# Patient Record
Sex: Female | Born: 2000 | Race: White | Hispanic: No | Marital: Single
Health system: Southern US, Community
[De-identification: ages and names within clinical notes are randomized; demographics above are authoritative.]

## PROBLEM LIST (undated history)

## (undated) DIAGNOSIS — F32A Depression, unspecified: Secondary | ICD-10-CM

## (undated) DIAGNOSIS — F419 Anxiety disorder, unspecified: Secondary | ICD-10-CM

## (undated) DIAGNOSIS — F909 Attention-deficit hyperactivity disorder, unspecified type: Secondary | ICD-10-CM

## (undated) HISTORY — DX: Anxiety disorder, unspecified: F41.9

## (undated) HISTORY — DX: Depression, unspecified: F32.A

## (undated) HISTORY — DX: Attention-deficit hyperactivity disorder, unspecified type: F90.9

---
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
## 2023-08-03 DIAGNOSIS — F4310 Post-traumatic stress disorder, unspecified: Secondary | ICD-10-CM | POA: Diagnosis not present

## 2023-08-03 DIAGNOSIS — F902 Attention-deficit hyperactivity disorder, combined type: Secondary | ICD-10-CM | POA: Diagnosis not present

## 2023-08-03 DIAGNOSIS — F331 Major depressive disorder, recurrent, moderate: Secondary | ICD-10-CM | POA: Diagnosis not present

## 2023-09-22 DIAGNOSIS — F4311 Post-traumatic stress disorder, acute: Secondary | ICD-10-CM | POA: Diagnosis not present

## 2023-09-27 DIAGNOSIS — F4311 Post-traumatic stress disorder, acute: Secondary | ICD-10-CM | POA: Diagnosis not present

## 2023-10-03 DIAGNOSIS — F4311 Post-traumatic stress disorder, acute: Secondary | ICD-10-CM | POA: Diagnosis not present

## 2024-03-04 DIAGNOSIS — R519 Headache, unspecified: Secondary | ICD-10-CM | POA: Diagnosis not present

## 2024-03-04 DIAGNOSIS — J019 Acute sinusitis, unspecified: Secondary | ICD-10-CM | POA: Diagnosis not present

## 2024-04-04 ENCOUNTER — Ambulatory Visit: Admitting: Internal Medicine

## 2024-04-04 ENCOUNTER — Encounter: Payer: Self-pay | Admitting: Internal Medicine

## 2024-04-04 VITALS — BP 120/80 | HR 84 | Temp 97.6°F | Ht 67.0 in | Wt 169.4 lb

## 2024-04-04 DIAGNOSIS — F419 Anxiety disorder, unspecified: Secondary | ICD-10-CM

## 2024-04-04 DIAGNOSIS — F909 Attention-deficit hyperactivity disorder, unspecified type: Secondary | ICD-10-CM

## 2024-04-04 DIAGNOSIS — F32A Depression, unspecified: Secondary | ICD-10-CM | POA: Diagnosis not present

## 2024-04-04 MED ORDER — AMPHETAMINE-DEXTROAMPHETAMINE 20 MG PO TABS: 20.0000 mg | ORAL_TABLET | Freq: Two times a day (BID) | ORAL | 0 refills | Status: DC

## 2024-04-04 MED ORDER — FLUOXETINE HCL 20 MG PO CAPS: 20.0000 mg | ORAL_CAPSULE | Freq: Every day | ORAL | 1 refills | Status: DC

## 2024-04-04 NOTE — Progress Notes (Signed)
 [facility] [address] [place] [address] Dept: [phone] Dept Fax: [phone]  New Patient Office Visit  Subjective:   Courtney Henry [birth year] 04/04/2024  Chief Complaint  Patient presents with   Establish Care   Medication Refill    HPI: LESSA HUGE presents today to establish care at [facility]. Introduced to Publishing rights manager role and practice setting.  All questions answered.  Concerns: See below   Discussed the use of AI scribe software for clinical note transcription with the patient, who gave verbal consent to proceed.  History of Present Illness   Courtney Henry is a 23 year old female with ADHD, anxiety, and depression who presents to establish care and resume medication management.  She has not been on her medications for about a year due to an insurance change and re-establishing care. Previously, she was taking Adderall 20 mg twice a day for ADHD, which she found effective for her attention issues, although she did not always take the second dose. For anxiety and depression, she was on Prozac, starting at 20 mg and increasing to 40 mg, which she felt helped manage her symptoms. She is unsure if she should restart at 20 mg or a different dose.  Since discontinuing her medications, her anxiety and depression are still present but not as severe as before. No SI/HI. She has not engaged in counseling for about a year, although she has seen several counselors over the past few years. She plans to follow up with a counseling service she found in [place].        04/04/2024    2:16 PM  Depression screen PHQ 2/9  Decreased Interest 1  Down, Depressed, Hopeless 1  PHQ - 2 Score 2  Altered sleeping 0  Tired, decreased energy 3  Change in appetite 0  Feeling bad or failure about yourself  2  Trouble concentrating 3  Moving slowly or fidgety/restless 0  Suicidal  thoughts 0  PHQ-9 Score 10  Difficult doing work/chores Somewhat difficult      04/04/2024    2:16 PM  GAD 7 : Generalized Anxiety Score  Nervous, Anxious, on Edge 2  Control/stop worrying 2  Worry too much - different things 2  Trouble relaxing 2  Restless 1  Easily annoyed or irritable 3  Afraid - awful might happen 1  Total GAD 7 Score 13  Anxiety Difficulty Somewhat difficult      The following portions of the patient's history were reviewed and updated as appropriate: past medical history, past surgical history, family history, social history, allergies, medications, and problem list.   Patient Active Problem List   Diagnosis Date Noted   Left ankle injury, subsequent encounter 05/24/2017   Past Medical History:  Diagnosis Date   ADHD    Anxiety    Depression    History reviewed. No pertinent surgical history. Family History  Problem Relation Age of Onset   Diabetes Maternal Grandfather    Hypertension Maternal Grandfather    Diabetes Maternal Grandmother    Hypertension Maternal Grandmother    Alcohol abuse Paternal Grandfather    Heart disease Paternal Grandmother    Hypertension Paternal Grandmother     Current Outpatient Medications:    amphetamine-dextroamphetamine (ADDERALL) 20 MG tablet, Take 1 tablet (20 mg total) by mouth 2 (two) times daily., Disp: 60 tablet, Rfl: 0   FLUoxetine (PROZAC) 20 MG capsule, Take 1 capsule (20 mg total) by  mouth daily., Disp: 90 capsule, Rfl: 1   ibuprofen  (ADVIL ,MOTRIN ) 400 MG tablet, Take 400 mg by mouth every 6 (six) hours as needed., Disp: , Rfl:  No Known Allergies  ROS: A complete ROS was performed with pertinent positives/negatives noted in the HPI. The remainder of the ROS are negative.   Objective:   Today's Vitals   04/04/24 1354  BP: 120/80  Pulse: 84  Temp: 97.6 F (36.4 C)  TempSrc: Temporal  SpO2: 99%  Weight: 169 lb 6.4 oz (76.8 kg)  Height: 5\' 7"  (1.702 m)    GENERAL: Well-appearing, in NAD.  Well nourished.  SKIN: Pink, warm and dry. No rash, lesion, ulceration, or ecchymoses.  NECK: Trachea midline. Full ROM w/o pain or tenderness. No lymphadenopathy.  RESPIRATORY: Chest wall symmetrical. Respirations even and non-labored. Breath sounds clear to auscultation bilaterally.  CARDIAC: S1, S2 present, regular rate and rhythm. Peripheral pulses 2+ bilaterally.  EXTREMITIES: Without clubbing, cyanosis, or edema.  NEUROLOGIC:  Steady, even gait.  PSYCH/MENTAL STATUS: Alert, oriented x 3. Cooperative, appropriate mood and affect.   Health Maintenance Due  Topic Date Due   CHLAMYDIA SCREENING  Never done   HIV Screening  Never done   Meningococcal B Vaccine (1 of 2 - Standard) Never done   Hepatitis C Screening  Never done   Cervical Cancer Screening (Pap smear)  Never done   DTaP/Tdap/Td (7 - Td or Tdap) 07/04/2021   COVID-19 Vaccine (3 - 2024-25 season) 07/17/2023    No results found for any visits on 04/04/24.  Assessment & Plan:   Assessment and Plan    Attention-deficit hyperactivity disorder ADHD symptoms well-controlled with Adderall previously. Off medication for one year, desires to restart. - Restart Adderall 20 mg twice daily. - Perform urine drug screen. - Sign controlled substance agreement.  Anxiety disorder Anxiety symptoms less severe off Prozac for one year. Open to restarting medication and counseling. - Restart Prozac 20 mg daily. Can increase to 40mg  if needed in future - Encourage resumption of counseling services.  Depression Depression symptoms less severe off Prozac for one year. Open to restarting medication and counseling. - Restart Prozac 20 mg daily. Can increase to 40mg  if needed in future - Encourage resumption of counseling services.        Orders Placed This Encounter  Procedures   ToxASSURE Select 13 (MW), Urine     Current Outpatient Medications:    amphetamine-dextroamphetamine (ADDERALL) 20 MG tablet, Take 1 tablet (20 mg  total) by mouth 2 (two) times daily., Disp: 60 tablet, Rfl: 0   FLUoxetine (PROZAC) 20 MG capsule, Take 1 capsule (20 mg total) by mouth daily., Disp: 90 capsule, Rfl: 1   ibuprofen  (ADVIL ,MOTRIN ) 400 MG tablet, Take 400 mg by mouth every 6 (six) hours as needed., Disp: , Rfl:    Meds ordered this encounter  Medications   FLUoxetine (PROZAC) 20 MG capsule    Sig: Take 1 capsule (20 mg total) by mouth daily.    Dispense:  90 capsule    Refill:  1    Supervising Provider:   THOMPSON, AARON B [[id number]]   amphetamine-dextroamphetamine (ADDERALL) 20 MG tablet    Sig: Take 1 tablet (20 mg total) by mouth 2 (two) times daily.    Dispense:  60 tablet    Refill:  0    Supervising Provider:   THOMPSON, AARON B [[id number]]    Return in about 3 months (around 07/05/2024) for Annual Physical Exam with fasting lab work.  Gavin Kast, FNP

## 2024-04-05 DIAGNOSIS — F32A Depression, unspecified: Secondary | ICD-10-CM | POA: Insufficient documentation

## 2024-04-05 DIAGNOSIS — F909 Attention-deficit hyperactivity disorder, unspecified type: Secondary | ICD-10-CM | POA: Insufficient documentation

## 2024-04-07 LAB — TOXASSURE SELECT 13 (MW), URINE

## 2024-05-15 ENCOUNTER — Encounter: Payer: Self-pay | Admitting: Internal Medicine

## 2024-05-15 DIAGNOSIS — F909 Attention-deficit hyperactivity disorder, unspecified type: Secondary | ICD-10-CM

## 2024-05-17 MED ORDER — AMPHETAMINE-DEXTROAMPHETAMINE 20 MG PO TABS: 20.0000 mg | ORAL_TABLET | Freq: Two times a day (BID) | ORAL | 0 refills | Status: DC

## 2024-07-05 ENCOUNTER — Encounter: Admitting: Internal Medicine

## 2024-07-11 ENCOUNTER — Encounter: Payer: Self-pay | Admitting: Internal Medicine

## 2024-07-11 ENCOUNTER — Ambulatory Visit (INDEPENDENT_AMBULATORY_CARE_PROVIDER_SITE_OTHER): Admitting: Internal Medicine

## 2024-07-11 VITALS — BP 104/64 | HR 98 | Temp 98.7°F | Ht 67.0 in | Wt 158.6 lb

## 2024-07-11 DIAGNOSIS — Z114 Encounter for screening for human immunodeficiency virus [HIV]: Secondary | ICD-10-CM

## 2024-07-11 DIAGNOSIS — Z0000 Encounter for general adult medical examination without abnormal findings: Secondary | ICD-10-CM | POA: Diagnosis not present

## 2024-07-11 DIAGNOSIS — Z23 Encounter for immunization: Secondary | ICD-10-CM | POA: Diagnosis not present

## 2024-07-11 DIAGNOSIS — Z1159 Encounter for screening for other viral diseases: Secondary | ICD-10-CM | POA: Diagnosis not present

## 2024-07-11 LAB — CBC WITH DIFFERENTIAL/PLATELET
Basophils Absolute: 0.1 K/uL (ref 0.0–0.1)
Basophils Relative: 1.2 % (ref 0.0–3.0)
Eosinophils Absolute: 0.1 K/uL (ref 0.0–0.7)
Eosinophils Relative: 1.1 % (ref 0.0–5.0)
HCT: 37.7 % (ref 36.0–46.0)
Hemoglobin: 12.3 g/dL (ref 12.0–15.0)
Lymphocytes Relative: 27.6 % (ref 12.0–46.0)
Lymphs Abs: 1.5 K/uL (ref 0.7–4.0)
MCHC: 32.5 g/dL (ref 30.0–36.0)
MCV: 83.9 fl (ref 78.0–100.0)
Monocytes Absolute: 0.5 K/uL (ref 0.1–1.0)
Monocytes Relative: 9.1 % (ref 3.0–12.0)
Neutro Abs: 3.3 K/uL (ref 1.4–7.7)
Neutrophils Relative %: 61 % (ref 43.0–77.0)
Platelets: 230 K/uL (ref 150.0–400.0)
RBC: 4.5 Mil/uL (ref 3.87–5.11)
RDW: 14.5 % (ref 11.5–15.5)
WBC: 5.4 K/uL (ref 4.0–10.5)

## 2024-07-11 LAB — LIPID PANEL
Cholesterol: 180 mg/dL (ref 0–200)
HDL: 72.1 mg/dL (ref >39.00)
LDL Cholesterol: 100 mg/dL — ABNORMAL HIGH (ref 0–99)
NonHDL: 108.15
Total CHOL/HDL Ratio: 3
Triglycerides: 39 mg/dL (ref 0.0–149.0)
VLDL: 7.8 mg/dL (ref 0.0–40.0)

## 2024-07-11 LAB — COMPREHENSIVE METABOLIC PANEL WITH GFR
ALT: 21 U/L (ref 0–35)
AST: 24 U/L (ref 0–37)
Albumin: 4.5 g/dL (ref 3.5–5.2)
Alkaline Phosphatase: 81 U/L (ref 39–117)
BUN: 14 mg/dL (ref 6–23)
CO2: 25 meq/L (ref 19–32)
Calcium: 9.3 mg/dL (ref 8.4–10.5)
Chloride: 104 meq/L (ref 96–112)
Creatinine, Ser: 0.86 mg/dL (ref 0.40–1.20)
GFR: 94.68 mL/min (ref >60.00)
Glucose, Bld: 89 mg/dL (ref 70–99)
Potassium: 3.7 meq/L (ref 3.5–5.1)
Sodium: 138 meq/L (ref 135–145)
Total Bilirubin: 0.6 mg/dL (ref 0.2–1.2)
Total Protein: 7.3 g/dL (ref 6.0–8.3)

## 2024-07-11 LAB — TSH: TSH: 1.16 u[IU]/mL (ref 0.35–5.50)

## 2024-07-11 NOTE — Progress Notes (Signed)
 Subjective:   Courtney Henry 08/05/2001  07/11/2024   CC: Chief Complaint  Patient presents with   Annual Exam    Fasting     HPI: Courtney Henry is a 23 y.o. female who presents for a routine health maintenance exam. Patient is doing well, no concerns. 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  Labs  collected at time of visit.    HEALTH SCREENINGS: - Pap smear: pt verbalized she will do with OBGYN  - Mammogram (40+): Not applicable  - Colonoscopy (45+): Not applicable  - Bone Density (65+): Not applicable  - Lung CA screening with low-dose CT:  Not applicable Adults age 52-80 who are current cigarette smokers or quit within the last 15 years. Must have 20 pack year history.   IMMUNIZATIONS: - Tdap: Tetanus vaccination status reviewed: Tdap vaccination indicated and given today. - HPV: Up to date - Influenza: Postponed to flu season - Prevnar 20: Not applicable - Zostavax (50+): Not applicable   Past medical history, surgical history, medications, allergies, family history and social history reviewed with patient today and changes made to appropriate areas of the chart.   Social History   Socioeconomic History   Marital status: Single    Spouse name: Not on file   Number of children: Not on file   Years of education: Not on file   Highest education level: Associate degree: academic program  Occupational History   Not on file  Tobacco Use   Smoking status: Passive Smoke Exposure - Never Smoker    Passive exposure: Yes   Smokeless tobacco: Never  Vaping Use   Vaping status: Never Used  Substance and Sexual Activity   Alcohol use: Yes    Comment: 5 drinks wine/beer every other weekend   Drug use: No   Sexual activity: Yes    Birth control/protection: Condom  Other Topics Concern   Not on file  Social History Narrative   Not on file   Social Drivers of Health   Financial Resource Strain: Low Risk  (04/03/2024)   Overall Financial Resource Strain  (CARDIA)    Difficulty of Paying Living Expenses: Not hard at all  Food Insecurity: No Food Insecurity (04/03/2024)   Hunger Vital Sign    Worried About Running Out of Food in the Last Year: Never true    Ran Out of Food in the Last Year: Never true  Transportation Needs: No Transportation Needs (04/03/2024)   PRAPARE - Administrator, Civil Service (Medical): No    Lack of Transportation (Non-Medical): No  Physical Activity: Sufficiently Active (04/03/2024)   Exercise Vital Sign    Days of Exercise per Week: 3 days    Minutes of Exercise per Session: 60 min  Stress: Stress Concern Present (04/03/2024)   [employer] of Occupational Health - Occupational Stress Questionnaire    Feeling of Stress : To some extent  Social Connections: Unknown (04/03/2024)   Social Connection and Isolation Panel    Frequency of Communication with Friends and Family: Twice a week    Frequency of Social Gatherings with Friends and Family: Once a week    Attends Religious Services: 1 to 4 times per year    Active Member of [employer] or Organizations: No    Attends Banker Meetings: Not on file    Marital Status: Not on file  Intimate Partner Violence: Unknown (02/17/2022)   Received from [facility]   HITS    Physically  Hurt: Not on file    Insult or Talk Down To: Not on file    Threaten Physical Harm: Not on file    Scream or Curse: Not on file     Past Medical History:  Diagnosis Date   ADHD    Anxiety    Depression     History reviewed. No pertinent surgical history.  Current Outpatient Medications on File Prior to Visit  Medication Sig   amphetamine -dextroamphetamine  (ADDERALL) 20 MG tablet Take 1 tablet (20 mg total) by mouth 2 (two) times daily.   amphetamine -dextroamphetamine  (ADDERALL) 20 MG tablet Take 1 tablet (20 mg total) by mouth 2 (two) times daily.   [START ON 07/18/2024] amphetamine -dextroamphetamine  (ADDERALL) 20 MG tablet Take 1 tablet (20 mg total) by  mouth 2 (two) times daily.   FLUoxetine  (PROZAC ) 20 MG capsule Take 1 capsule (20 mg total) by mouth daily.   ibuprofen  (ADVIL ,MOTRIN ) 400 MG tablet Take 400 mg by mouth every 6 (six) hours as needed.   No current facility-administered medications on file prior to visit.    No Known Allergies  Family History  Problem Relation Age of Onset   Diabetes Maternal Grandfather    Hypertension Maternal Grandfather    Diabetes Maternal Grandmother    Hypertension Maternal Grandmother    Alcohol abuse Paternal Grandfather    Heart disease Paternal Grandmother    Hypertension Paternal Grandmother      ROS: Denies fever, fatigue, unexplained weight loss/gain, hearing or vision changes, cardiac or respiratory complaints. Denies neurological deficits, musculoskeletal complaints, gastrointestinal or genitourinary complaints, mental health complaints, and skin changes.   Objective:   Today's Vitals   07/11/24 0936  BP: 104/64  Pulse: 98  Temp: 98.7 F (37.1 C)  TempSrc: Temporal  SpO2: 98%  Weight: 158 lb 9.6 oz (71.9 kg)  Height: 5' 7 (1.702 m)    GENERAL APPEARANCE: Well-appearing, in NAD. Well nourished.  SKIN: Pink, warm and dry. Turgor normal. No rash, lesion, ulceration, or ecchymoses. Hair evenly distributed.  HEENT: HEAD: Normocephalic.  EYES: PERRLA. EOMI. Lids intact w/o defect. Sclera white, Conjunctiva pink w/o exudate.  EARS: External ear w/o redness, swelling, masses or lesions. EAC clear. TM's intact, translucent w/o bulging, appropriate landmarks visualized. Appropriate acuity to conversational tones.  NOSE: Septum midline w/o deformity. Nares patent, mucosa pink and non-inflamed w/o drainage.  THROAT: Uvula midline. Oropharynx clear. Tonsils non-inflamed w/o exudate . Oral mucosa pink and moist.  NECK: Supple, Trachea midline. Full ROM w/o pain or tenderness. No lymphadenopathy. Thyroid non-tender w/o enlargement or palpable masses.  BREASTS: Breasts pendulous,  symmetrical, and w/o palpable masses. Nipples everted and w/o discharge. No rash or skin retraction. No axillary or supraclavicular lymphadenopathy.  RESPIRATORY: Chest wall symmetrical w/o masses. Respirations even and non-labored. Breath sounds clear to auscultation bilaterally. No wheezes, rales, rhonchi, or crackles. CARDIAC: S1, S2 present, regular rate and rhythm. No gallops, murmurs, rubs, or clicks. Capillary refill <2 seconds. Peripheral pulses 2+ bilaterally. GI: Abdomen soft w/o distention. Normoactive bowel sounds. No palpable masses or tenderness. No guarding or rebound tenderness. Liver and spleen w/o tenderness or enlargement. No CVA tenderness.  MSK: Muscle tone and strength appropriate for age, w/o atrophy or abnormal movement.  EXTREMITIES: Active ROM intact, w/o tenderness, crepitus, or contracture. No obvious joint deformities or effusions. No clubbing, edema, or cyanosis.  NEUROLOGIC: CN's II-XII intact. Motor strength symmetrical with no obvious weakness. No sensory deficits. Steady, even gait.  PSYCH/MENTAL STATUS: Alert, oriented x 3. Cooperative, appropriate mood and  affect.   Depression and Anxiety Screen done today and results listed below:     07/11/2024    9:39 AM 04/04/2024    2:16 PM  Depression screen PHQ 2/9  Decreased Interest 2 1  Down, Depressed, Hopeless 2 1  PHQ - 2 Score 4 2  Altered sleeping 1 0  Tired, decreased energy 2 3  Change in appetite 0 0  Feeling bad or failure about yourself  1 2  Trouble concentrating 0 3  Moving slowly or fidgety/restless 0 0  Suicidal thoughts 0 0  PHQ-9 Score 8 10  Difficult doing work/chores Somewhat difficult Somewhat difficult      07/11/2024    9:40 AM 04/04/2024    2:16 PM  GAD 7 : Generalized Anxiety Score  Nervous, Anxious, on Edge 1 2  Control/stop worrying 1 2  Worry too much - different things 1 2  Trouble relaxing 1 2  Restless 0 1  Easily annoyed or irritable 2 3  Afraid - awful might happen 0 1   Total GAD 7 Score 6 13  Anxiety Difficulty Somewhat difficult Somewhat difficult     Results for orders placed or performed in visit on 04/04/24  Tox[employer] Select 13 (MW), Urine   Collection Time: 04/04/24  2:26 PM  Result Value Ref Range   Summary FINAL     Assessment & Plan:  Encounter for general adult medical examination without abnormal findings -     CBC with Differential/Platelet -     Comprehensive metabolic panel with GFR -     TSH -     Lipid panel  Immunization due -     Tdap vaccine greater than or equal to 7yo IM  Encounter for screening for HIV -     HIV Antibody (routine testing w rflx)  Need for hepatitis C screening test -     Hepatitis C antibody    Orders Placed This Encounter  Procedures   Tdap vaccine greater than or equal to 7yo IM   CBC with Differential/Platelet   Comprehensive metabolic panel with GFR   TSH   Lipid panel   HIV antibody (with reflex)   Hepatitis C antibody    PATIENT COUNSELING:  - Encourage a healthy well-balanced diet. Patient may adjust caloric intake to maintain or achieve ideal body weight. May reduce intake of dietary saturated fat and total fat and have adequate dietary potassium and calcium preferably from fresh fruits, vegetables, and low-fat dairy products.   -  importance of regular exercise  NEXT PREVENTATIVE PHYSICAL DUE IN 1 YEAR.  Return in about 1 year (around 07/11/2025) for Annual Physical Exam with fasting lab work.  Rosina Senters, FNP

## 2024-07-12 LAB — HEPATITIS C ANTIBODY: Hepatitis C Ab: NONREACTIVE

## 2024-07-12 LAB — HIV ANTIBODY (ROUTINE TESTING W REFLEX): HIV 1&2 Ab, 4th Generation: NONREACTIVE

## 2024-07-13 ENCOUNTER — Ambulatory Visit: Payer: Self-pay | Admitting: Internal Medicine

## 2024-07-13 DIAGNOSIS — F419 Anxiety disorder, unspecified: Secondary | ICD-10-CM

## 2024-08-06 MED ORDER — FLUOXETINE HCL 20 MG PO CAPS: 40.0000 mg | ORAL_CAPSULE | Freq: Every day | ORAL | 1 refills | Status: DC

## 2024-09-29 ENCOUNTER — Encounter: Payer: Self-pay | Admitting: Internal Medicine

## 2024-09-29 DIAGNOSIS — F909 Attention-deficit hyperactivity disorder, unspecified type: Secondary | ICD-10-CM

## 2024-10-01 NOTE — Telephone Encounter (Signed)
 Pt is requesting refill for amphetamine -dextroamphetamine  (ADDERALL) 20 MG tablet   LOV 07/11/24 FOV 07/12/25 LRF 07/18/24

## 2024-10-02 MED ORDER — AMPHETAMINE-DEXTROAMPHETAMINE 20 MG PO TABS: 20.0000 mg | ORAL_TABLET | Freq: Two times a day (BID) | ORAL | 0 refills | Status: AC

## 2024-10-26 ENCOUNTER — Other Ambulatory Visit: Payer: Self-pay | Admitting: Internal Medicine

## 2024-10-26 DIAGNOSIS — F419 Anxiety disorder, unspecified: Secondary | ICD-10-CM

## 2024-10-26 NOTE — Telephone Encounter (Signed)
 Pt requesting 90 day supply for FLUoxetine  (PROZAC ) 20 MG capsule   LOV 07/11/24 FOV not scheduled LRF 08/06/24

## 2024-12-11 ENCOUNTER — Other Ambulatory Visit: Payer: Self-pay | Admitting: Internal Medicine

## 2024-12-11 DIAGNOSIS — F32A Depression, unspecified: Secondary | ICD-10-CM

## 2024-12-11 NOTE — Telephone Encounter (Signed)
 Requesting: FLUOXETINE  HCL 20 MG CAPSULE  Last Visit: 07/11/2024 Next Visit: 07/12/2025 Last Refill: 10/29/2024  Please Advise

## 2025-07-12 ENCOUNTER — Encounter: Admitting: Internal Medicine
